# Patient Record
Sex: Male | Born: 1985 | Race: Black or African American | Hispanic: No | Smoking: Current some day smoker
Health system: Southern US, Community
[De-identification: ages and names within clinical notes are randomized; demographics above are authoritative.]

## PROBLEM LIST (undated history)

## (undated) DIAGNOSIS — J45909 Unspecified asthma, uncomplicated: Secondary | ICD-10-CM

---

## 2008-12-12 ENCOUNTER — Emergency Department: Payer: Self-pay | Admitting: Internal Medicine

## 2009-12-20 ENCOUNTER — Emergency Department: Payer: Self-pay | Admitting: Emergency Medicine

## 2010-10-06 ENCOUNTER — Emergency Department: Payer: Self-pay | Admitting: Emergency Medicine

## 2010-10-08 ENCOUNTER — Emergency Department: Payer: Self-pay | Admitting: Emergency Medicine

## 2015-07-15 ENCOUNTER — Emergency Department
Admission: EM | Admit: 2015-07-15 | Discharge: 2015-07-15 | Disposition: A | Discharge status: Home / Self Care | Payer: BLUE CROSS/BLUE SHIELD | Attending: Emergency Medicine | Admitting: Emergency Medicine

## 2015-07-15 ENCOUNTER — Emergency Department: Payer: BLUE CROSS/BLUE SHIELD

## 2015-07-15 ENCOUNTER — Encounter: Payer: Self-pay | Admitting: Emergency Medicine

## 2015-07-15 DIAGNOSIS — F17200 Nicotine dependence, unspecified, uncomplicated: Secondary | ICD-10-CM | POA: Insufficient documentation

## 2015-07-15 DIAGNOSIS — R05 Cough: Secondary | ICD-10-CM | POA: Diagnosis present

## 2015-07-15 DIAGNOSIS — J069 Acute upper respiratory infection, unspecified: Secondary | ICD-10-CM | POA: Insufficient documentation

## 2015-07-15 LAB — RAPID INFLUENZA A&B ANTIGENS (ARMC ONLY): INFLUENZA A (ARMC): NEGATIVE

## 2015-07-15 LAB — RAPID INFLUENZA A&B ANTIGENS: Influenza B (ARMC): NEGATIVE

## 2015-07-15 MED ORDER — PREDNISONE 10 MG PO TABS: ORAL_TABLET | ORAL | Status: AC

## 2015-07-15 MED ORDER — ACETAMINOPHEN 500 MG PO TABS
1000.0000 mg | ORAL_TABLET | Freq: Once | ORAL | Status: AC
  Administered 2015-07-15: 1000 mg via ORAL

## 2015-07-15 NOTE — ED Notes (Signed)
Patient states has had cough x 2 days. Fever began today.

## 2015-07-15 NOTE — ED Provider Notes (Signed)
[facility] Emergency Department Provider Note  ____________________________________________  Time seen: Approximately 5:24 PM  I have reviewed the triage vital signs and the nursing notes.   HISTORY  Chief Complaint Cough   HPI Maurice Estrada is a 30 y.o. male is here with complaint of cough and congestion for 2 weeks. Yesterday states he got much worse and he began running fever. He states that last night he had problems with his asthma however he did have a inhaler and has been using it. On arrival to the emergency room in a temperature of 103.1. He has not taken any over-the-counter medication for his fever prior to his arrival.   History reviewed. No pertinent past medical history.  There are no active problems to display for this patient.   History reviewed. No pertinent past surgical history.  Current Outpatient Rx  Name  Route  Sig  Dispense  Refill  . predniSONE (DELTASONE) 10 MG tablet      Take 6 tablets  today, on day 2 take 5 tablets, day 3 take 4 tablets, day 4 take 3 tablets, day 5 take  2 tablets and 1 tablet the last day   21 tablet   0     Allergies Review of patient's allergies indicates no known allergies.  No family history on file.  Social History Social History  Substance Use Topics  . Smoking status: Current Some Day Smoker  . Smokeless tobacco: None  . Alcohol Use: Yes    Review of Systems Constitutional: No fever/chills ENT: No sore throat. Cardiovascular: Denies chest pain. Respiratory: Denies shortness of breath.Positive cough. Gastrointestinal: No abdominal pain.  No nausea, no vomiting.  No diarrhea.  Musculoskeletal: Generalized body aches. Skin: Negative for rash. Neurological: Positive for headaches, no focal weakness or numbness.  10-point ROS otherwise negative.  ____________________________________________   PHYSICAL EXAM:  VITAL SIGNS: ED Triage Vitals  Enc Vitals Group     BP 07/15/15 1536  148/53 mmHg     Pulse Rate 07/15/15 1536 99     Resp --      Temp 07/15/15 1536 103.1 F (39.5 C)     Temp Source 07/15/15 1536 Oral     SpO2 07/15/15 1536 97 %     Weight 07/15/15 1536 300 lb (136.079 kg)     Height 07/15/15 1536 6' (1.829 m)     Head Cir --      Peak Flow --      Pain Score 07/15/15 1538 8     Pain Loc --      Pain Edu? --      Excl. in GC? --     Constitutional: Alert and oriented. Well appearing and in no acute distress.Currently patient is somewhat clammy. He doesn't feel like his temperature is as high as it was when he arrived in the emergency room.He denies any chest pain or difficulty breathing. Eyes: Conjunctivae are normal. PERRL. EOMI. Head: Atraumatic. Nose: No congestion/rhinnorhea. EACs and TMs are clear bilaterally. Mouth/Throat: Mucous membranes are moist.  Oropharynx non-erythematous. Neck: No stridor.   Hematological/Lymphatic/Immunilogical: No cervical lymphadenopathy. Cardiovascular: Normal rate, regular rhythm. Grossly normal heart sounds.  Good peripheral circulation. Respiratory: Normal respiratory effort.  No retractions. Lungs CTAB. No wheezing is heard at present. Patient moving air with normal effort. Gastrointestinal: Soft and nontender. No distention. Musculoskeletal: Moves upper and lower extremities without difficulty. Normal gait was noted. Neurologic:  Normal speech and language. No gross focal neurologic deficits are appreciated. No gait  instability. Skin:  Skin is warm, dry and intact. No rash noted. Psychiatric: Mood and affect are normal. Speech and behavior are normal.  ____________________________________________   LABS (all labs ordered are listed, but only abnormal results are displayed)  Labs Reviewed  RAPID INFLUENZA A&B ANTIGENS ([facility] ONLY)   RADIOLOGY  Chest x-ray per radiologist shows no evidence of acute cardiopulmonary disease. ____________________________________________   PROCEDURES  Procedure(s)  performed: None  Critical Care performed: No  ____________________________________________   INITIAL IMPRESSION / ASSESSMENT AND PLAN / ED COURSE  Pertinent labs & imaging results that were available during my care of the patient were reviewed by me and considered in my medical decision making (see chart for details).  Patient's influenza test was negative for a and B. He was reassured that his chest x-ray did not show bronchitis or pneumonia. Patient will continue using his albuterol inhaler and Mucinex been taken at home. He was also given a prescription for prednisone should his asthma continue to flare however he is going to hold on to the prescription says currently he is not wheezing. Patient will [facility] if any continued problems. ____________________________________________   FINAL CLINICAL IMPRESSION(S) / ED DIAGNOSES  Final diagnoses:  Acute upper respiratory infection      Tommi RumpsRhonda L Agustus Mane, PA-C 07/15/15 1808  Rockne MenghiniAnne-Caroline Norman, MD 07/15/15 2147

## 2015-07-15 NOTE — Discharge Instructions (Signed)

## 2015-07-15 NOTE — ED Notes (Signed)
Pt with cough and congestion x 2 weeks; got worse yesterday. Fever developed overnight. Pt alert & oriented with NAD noted.

## 2016-01-26 ENCOUNTER — Encounter: Payer: Self-pay | Admitting: Emergency Medicine

## 2016-01-26 ENCOUNTER — Emergency Department
Admission: EM | Admit: 2016-01-26 | Discharge: 2016-01-26 | Disposition: A | Discharge status: Home / Self Care | Payer: BLUE CROSS/BLUE SHIELD | Attending: Emergency Medicine | Admitting: Emergency Medicine

## 2016-01-26 DIAGNOSIS — J45909 Unspecified asthma, uncomplicated: Secondary | ICD-10-CM | POA: Insufficient documentation

## 2016-01-26 DIAGNOSIS — M545 Low back pain, unspecified: Secondary | ICD-10-CM

## 2016-01-26 DIAGNOSIS — F17200 Nicotine dependence, unspecified, uncomplicated: Secondary | ICD-10-CM | POA: Insufficient documentation

## 2016-01-26 HISTORY — DX: Unspecified asthma, uncomplicated: J45.909

## 2016-01-26 NOTE — ED Triage Notes (Signed)
3 months ago pt began with back pain. Worse recently. Pain to lower back.

## 2016-01-26 NOTE — ED Provider Notes (Signed)
[facility] Emergency Department Provider Note   ____________________________________________    I have reviewed the triage vital signs and the nursing notes.   HISTORY  Chief Complaint Back Pain     HPI Maurice Estrada is a 30 y.o. male who presents with 3 months of intermittent aching, lower, midline back pain. He denies neuro deficits. No difficulty urinating. No fevers or chills. He notes back pain frequently occurs in the morning after sleeping. He denies abdominal pain. He has not taken anything for it currently.   Past Medical History:  Diagnosis Date  . Asthma     There are no active problems to display for this patient.   History reviewed. No pertinent surgical history.  Prior to Admission medications   Medication Sig Start Date End Date Taking? Authorizing Provider  predniSONE (DELTASONE) 10 MG tablet Take 6 tablets  today, on day 2 take 5 tablets, day 3 take 4 tablets, day 4 take 3 tablets, day 5 take  2 tablets and 1 tablet the last day 07/15/15   Tommi Rumps, PA-C     Allergies Review of patient's allergies indicates no known allergies.  History reviewed. No pertinent family history.  Social History Social History  Substance Use Topics  . Smoking status: Current Some Day Smoker  . Smokeless tobacco: Not on file  . Alcohol use Yes    Review of Systems  Constitutional: No fever/chills     Gastrointestinal: No abdominal pain.   Genitourinary: Negative forIncontinence Musculoskeletal: As above Skin: Negative for rash. Neurological: Negative for headaches     ____________________________________________   PHYSICAL EXAM:  VITAL SIGNS: ED Triage Vitals  Enc Vitals Group     BP 01/26/16 1529 135/65     Pulse Rate 01/26/16 1529 64     Resp 01/26/16 1529 16     Temp 01/26/16 1529 98.3 F (36.8 C)     Temp Source 01/26/16 1529 Oral     SpO2 01/26/16 1529 96 %     Weight 01/26/16 1530 280 lb (127 kg)   Height 01/26/16 1530 6' (1.829 m)     Head Circumference --      Peak Flow --      Pain Score --      Pain Loc --      Pain Edu? --      Excl. in GC? --     Constitutional: Alert and oriented. No acute distress. Pleasant and interactive Eyes: Conjunctivae are normal.   Nose: No congestion/rhinnorhea. Mouth/Throat: Mucous membranes are moist.   Cardiovascular: Normal rate, regular rhythm.  Respiratory: Normal respiratory effort.  No retractions. Genitourinary: deferred Musculoskeletal: Back: No vertebral tenderness to palpation, no paraspinal tenderness to palpation. Full range of motion without difficulty. Normal ambulation. Normal strength in the lower extremities Neurologic:  Normal speech and language. No gross focal neurologic deficits are appreciated.  No saddle anesthesia Skin:  Skin is warm, dry and intact. No rash noted.   ____________________________________________   LABS (all labs ordered are listed, but only abnormal results are displayed)  Labs Reviewed - No data to display ____________________________________________  EKG   ____________________________________________  RADIOLOGY  None ____________________________________________   PROCEDURES  Procedure(s) performed: No    Critical Care performed: No ____________________________________________   INITIAL IMPRESSION / ASSESSMENT AND PLAN / ED COURSE  Pertinent labs & imaging results that were available during my care of the patient were reviewed by me and considered in my medical decision making (see chart  for details).  Patient well-appearing and in no distress. Vital signs are normal. Exam is benign. We discussed lifestyle changes for his chronic back pain.   ____________________________________________   FINAL CLINICAL IMPRESSION(S) / ED DIAGNOSES  Final diagnoses:  Midline low back pain without sciatica, unspecified chronicity      NEW MEDICATIONS STARTED DURING THIS  VISIT:  Discharge Medication List as of 01/26/2016  3:54 PM       Note:  This document was prepared using Dragon voice recognition software and may include unintentional dictation errors.    Jene Everyobert Tama Grosz, MD 01/26/16 2110

## 2017-09-07 ENCOUNTER — Emergency Department
Admission: EM | Admit: 2017-09-07 | Discharge: 2017-09-07 | Disposition: A | Discharge status: Home / Self Care | Payer: BLUE CROSS/BLUE SHIELD | Attending: Emergency Medicine | Admitting: Emergency Medicine

## 2017-09-07 ENCOUNTER — Encounter: Payer: Self-pay | Admitting: Emergency Medicine

## 2017-09-07 DIAGNOSIS — R070 Pain in throat: Secondary | ICD-10-CM | POA: Insufficient documentation

## 2017-09-07 DIAGNOSIS — J029 Acute pharyngitis, unspecified: Secondary | ICD-10-CM

## 2017-09-07 DIAGNOSIS — J45909 Unspecified asthma, uncomplicated: Secondary | ICD-10-CM | POA: Diagnosis not present

## 2017-09-07 DIAGNOSIS — F17200 Nicotine dependence, unspecified, uncomplicated: Secondary | ICD-10-CM | POA: Insufficient documentation

## 2017-09-07 LAB — GROUP A STREP BY PCR: Group A Strep by PCR: NOT DETECTED

## 2017-09-07 MED ORDER — MAGIC MOUTHWASH W/LIDOCAINE: 5.0000 mL | Freq: Four times a day (QID) | ORAL | 0 refills | Status: AC

## 2017-09-07 NOTE — ED Triage Notes (Signed)
Pt states yesterday started with a sore throat and saw some white spots on the right side. Pt denies fevers or feeling sick.

## 2017-09-07 NOTE — Discharge Instructions (Signed)
Your rapid strep test was negative.  Follow discharge care instructions to take medication as directed.  Follow-up with PCP if complaints persist.

## 2017-09-07 NOTE — ED Provider Notes (Signed)
[facility] Emergency Department Provider Note   ____________________________________________   First MD Initiated Contact with Patient 09/07/17 1053     (approximate)  I have reviewed the triage vital signs and the nursing notes.   HISTORY  Chief Complaint Sore Throat    HPI Maurice Estrada is a 32 y.o. male patient complain of 1 day of sore throat.  Patient denies URI signs or symptoms.  Patient state noticed white spot on the right tonsil this morning.  Patient rates pain as a 2/10.  Patient described the pain is "sore".  No palliative measure for complaint.  Past Medical History:  Diagnosis Date  . Asthma     There are no active problems to display for this patient.   History reviewed. No pertinent surgical history.  Prior to Admission medications   Medication Sig Start Date End Date Taking? Authorizing Provider  magic mouthwash w/lidocaine SOLN Take 5 mLs by mouth 4 (four) times daily. 09/07/17   Joni Reining, PA-C  predniSONE (DELTASONE) 10 MG tablet Take 6 tablets  today, on day 2 take 5 tablets, day 3 take 4 tablets, day 4 take 3 tablets, day 5 take  2 tablets and 1 tablet the last day 07/15/15   Tommi Rumps, PA-C    Allergies Patient has no known allergies.  No family history on file.  Social History Social History   Tobacco Use  . Smoking status: Current Some Day Smoker  Substance Use Topics  . Alcohol use: Yes  . Drug use: Not on file    Review of Systems Constitutional: No fever/chills Eyes: No visual changes. ENT: Sore throat.   Cardiovascular: Denies chest pain. Respiratory: Denies shortness of breath. Gastrointestinal: No abdominal pain.  No nausea, no vomiting.  No diarrhea.  No constipation. Genitourinary: Negative for dysuria. Musculoskeletal: Negative for back pain. Skin: Negative for rash. Neurological: Negative for headaches, focal weakness or  numbness.   ____________________________________________   PHYSICAL EXAM:  VITAL SIGNS: ED Triage Vitals  Enc Vitals Group     BP 09/07/17 1026 131/66     Pulse Rate 09/07/17 1026 67     Resp 09/07/17 1026 16     Temp 09/07/17 1026 98.9 F (37.2 C)     Temp Source 09/07/17 1026 Oral     SpO2 09/07/17 1026 95 %     Weight 09/07/17 1027 270 lb (122.5 kg)     Height 09/07/17 1027 6' (1.829 m)     Head Circumference --      Peak Flow --      Pain Score 09/07/17 1032 2     Pain Loc --      Pain Edu? --      Excl. in GC? --    Constitutional: Alert and oriented. Well appearing and in no acute distress. Mouth/Throat: Mucous membranes are moist.  Oropharynx erythematous. Neck: No stridor.  No cervical spine tenderness to palpation. Hematological/Lymphatic/Immunilogical: No cervical lymphadenopathy. Cardiovascular: Normal rate, regular rhythm. Grossly normal heart sounds.  Good peripheral circulation. Respiratory: Normal respiratory effort.  No retractions. Lungs CTAB. Skin:  Skin is warm, dry and intact. No rash noted. Psychiatric: Mood and affect are normal. Speech and behavior are normal.  ____________________________________________   LABS (all labs ordered are listed, but only abnormal results are displayed)  Labs Reviewed  GROUP A STREP BY PCR   ____________________________________________  EKG   ____________________________________________  RADIOLOGY  ED MD interpretation:    Official radiology report(s): No results  found.  ____________________________________________   PROCEDURES  Procedure(s) performed: None  Procedures  Critical Care performed: No  ____________________________________________   INITIAL IMPRESSION / ASSESSMENT AND PLAN / ED COURSE  As part of my medical decision making, I reviewed the following data within the electronic MEDICAL RECORD NUMBER    Sore throat secondary to viral pharyngitis.  Discussed negative strep results with  patient.  Patient given discharge care instruction advised take medication as directed.  Patient advised to follow-up PCP for continued care.      ____________________________________________   FINAL CLINICAL IMPRESSION(S) / ED DIAGNOSES  Final diagnoses:  Sore throat     ED Discharge Orders        Ordered    magic mouthwash w/lidocaine SOLN  4 times daily     09/07/17 1340       Note:  This document was prepared using Dragon voice recognition software and may include unintentional dictation errors.    Joni Reining, PA-C 09/07/17 1342    Emily Filbert, MD 09/07/17 [phone]

## 2017-09-07 NOTE — ED Notes (Signed)
See triage note  Presents with "white" patches to right side of throat    States he noted area yesterday  No fever or diff swallowing

## 2017-11-01 IMAGING — CR DG CHEST 2V
1 series · 2 of 2 positions shown · non-contrast
Comparison: Chest x-ray 10/08/2010.

CLINICAL DATA: 29-year-old male with history of asthma attack
yesterday evening. Coughing for the past 2 weeks.

EXAM:
CHEST  2 VIEW

[Series 1: w chest pa · 0.14mm/px · 2 of 2 slices shown]
[im 1/2]
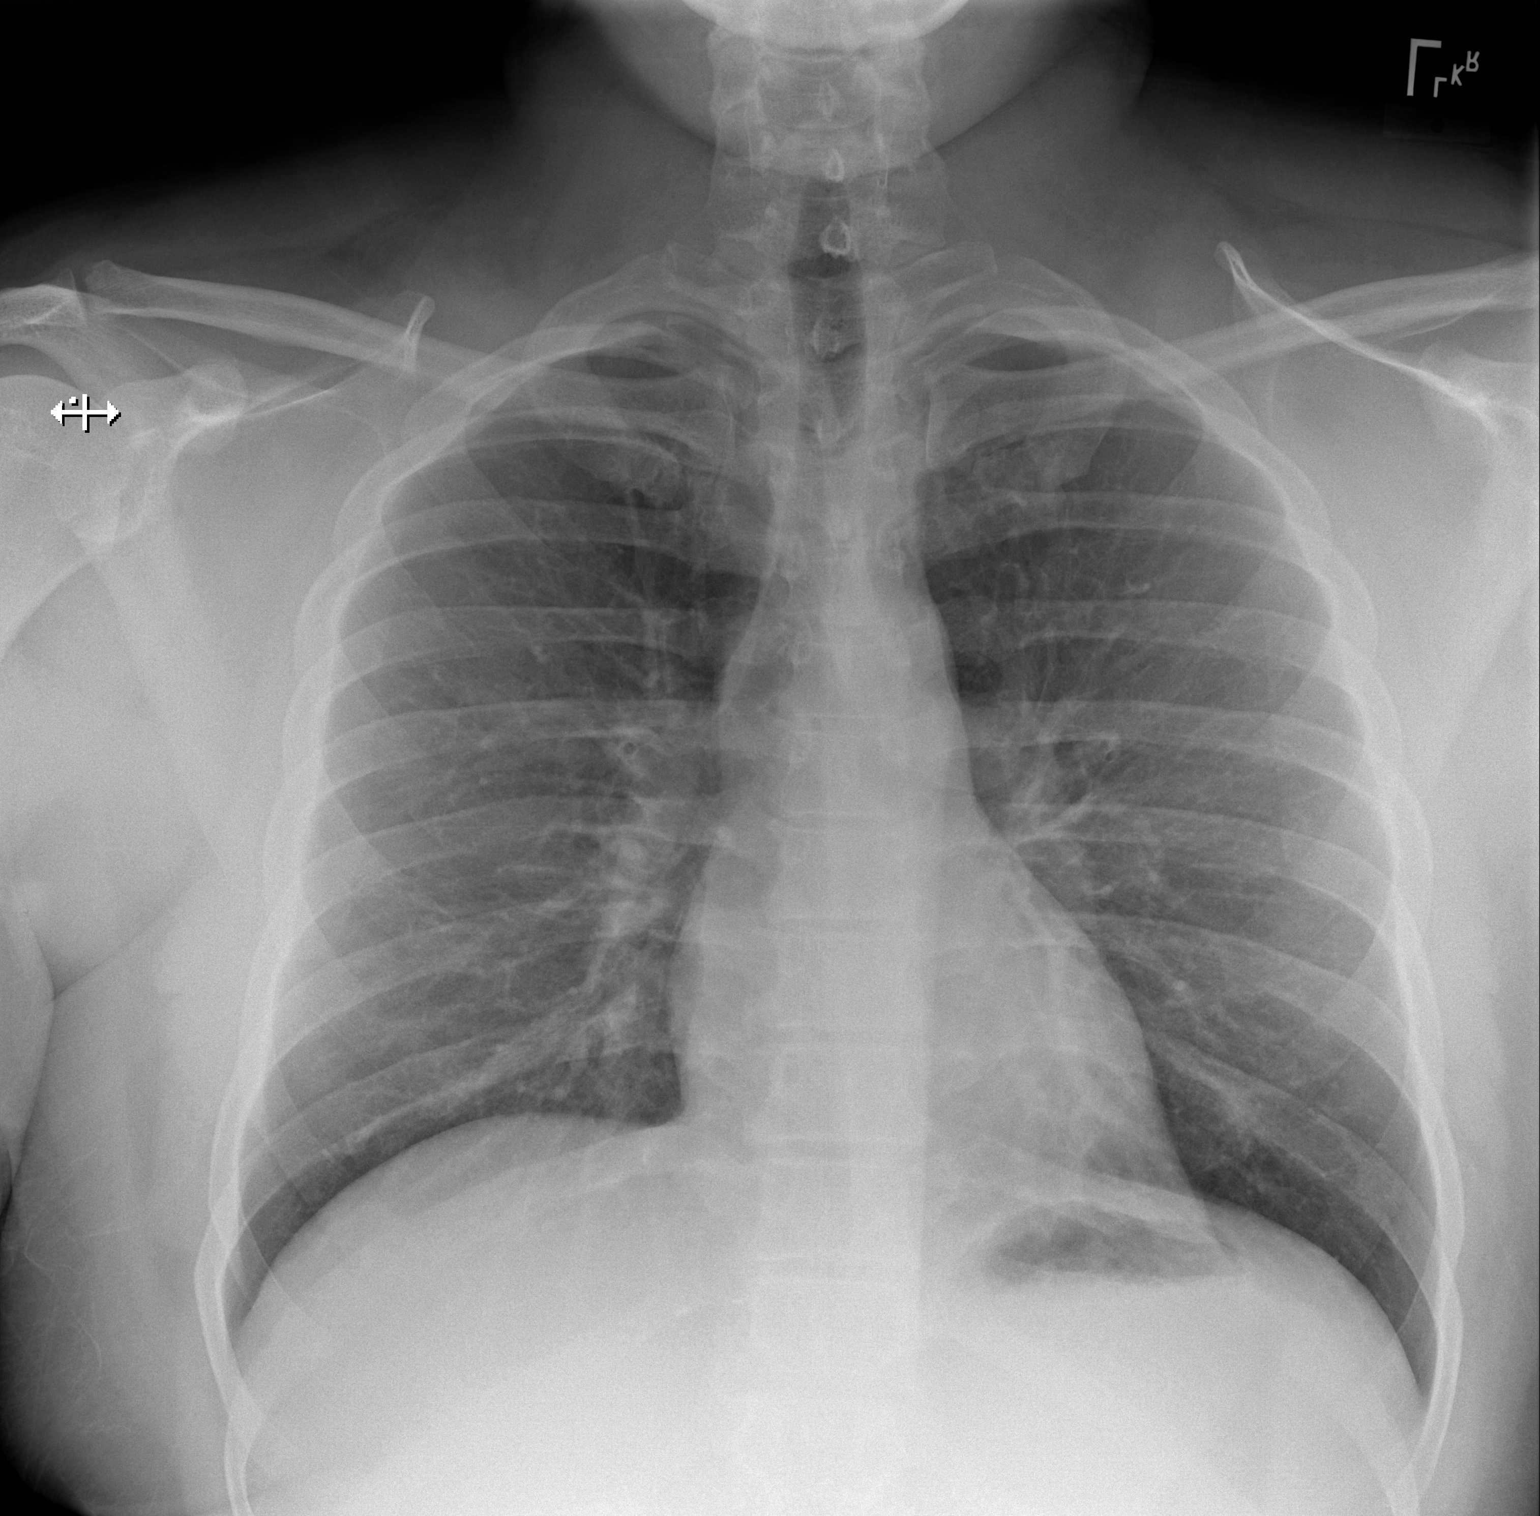
[im 2/2]
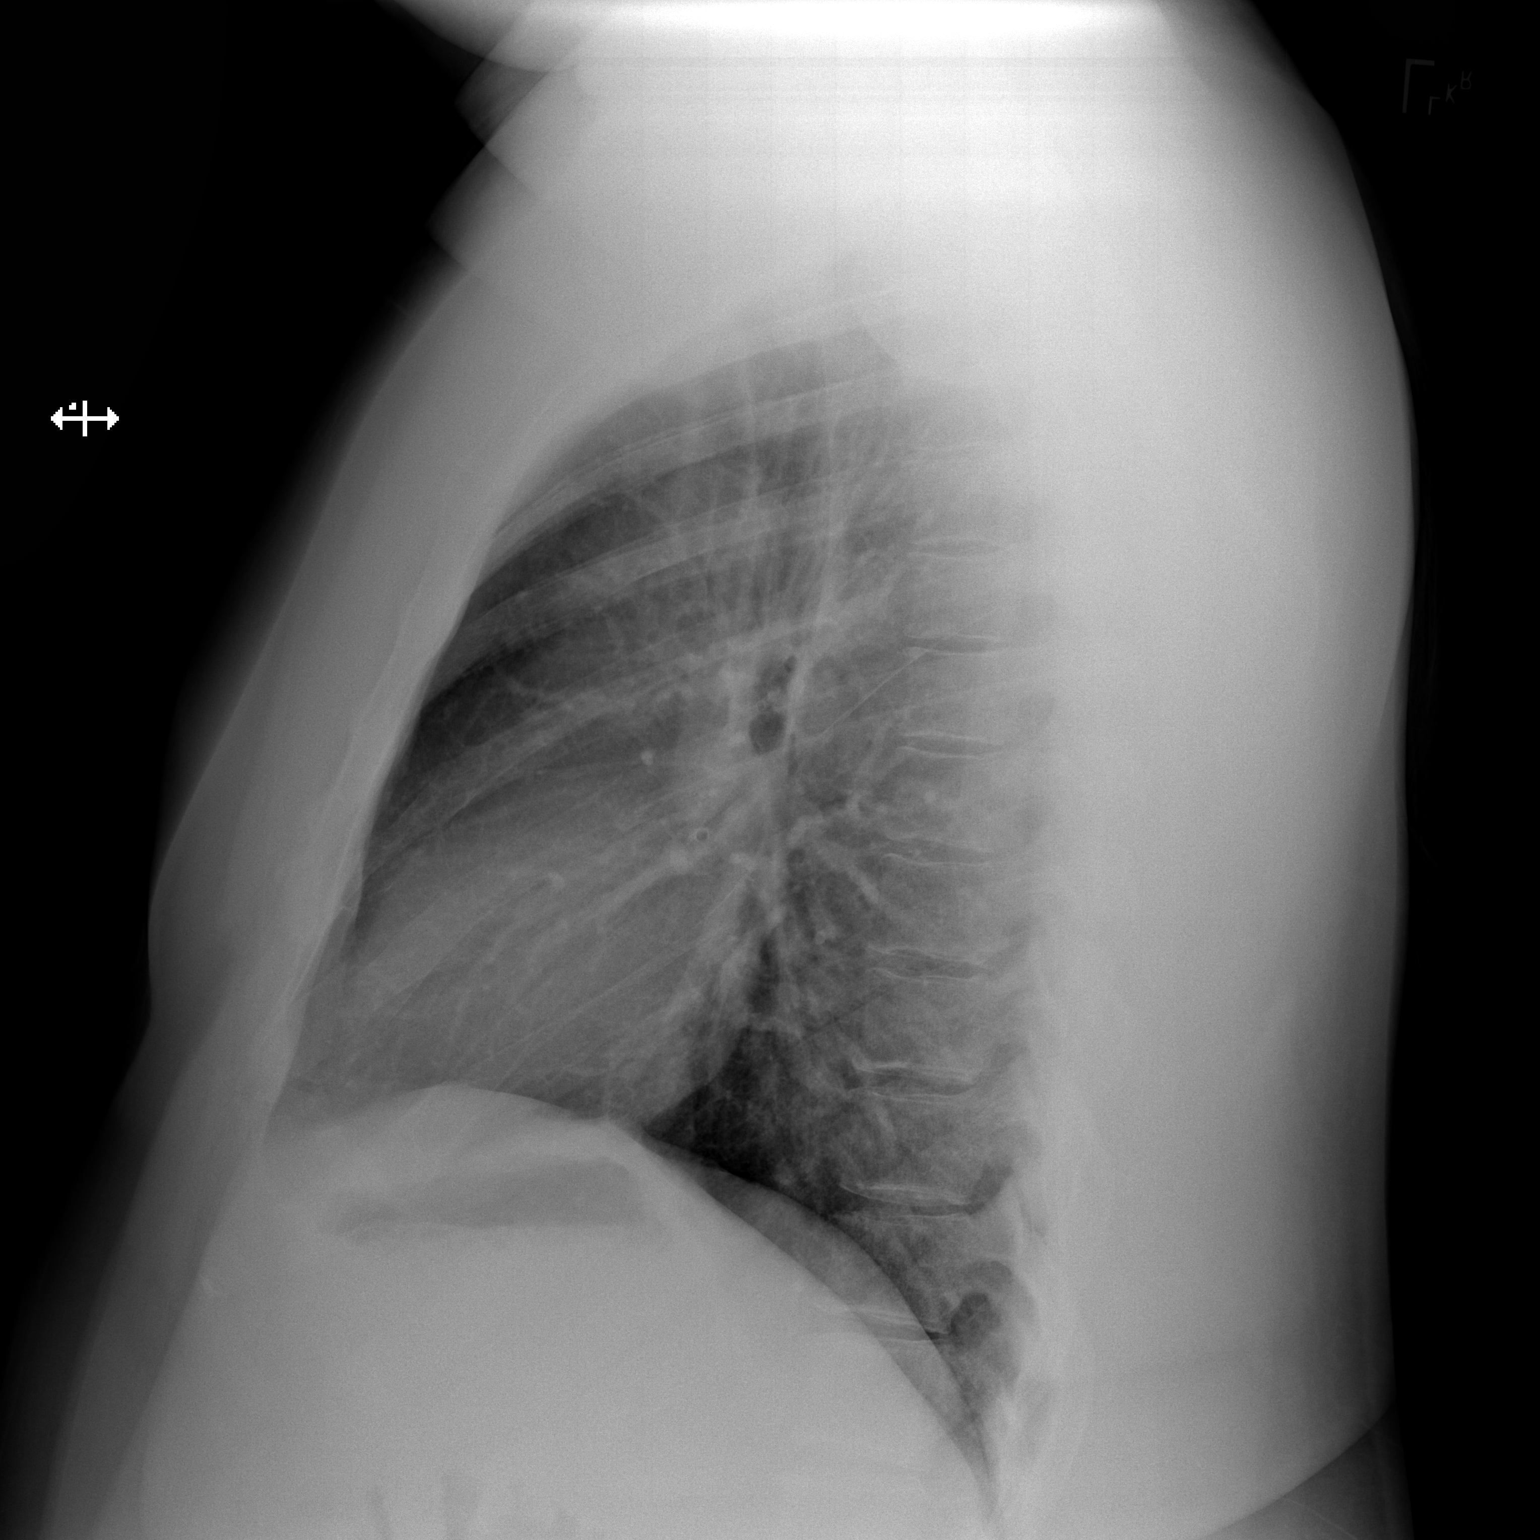

[2 of 2 positions shown; findings below may reference images not displayed]

FINDINGS: Lung volumes are normal. No consolidative airspace disease. No
pleural effusions. No pneumothorax. No pulmonary nodule or mass
noted. Pulmonary vasculature and the cardiomediastinal silhouette
are within normal limits.
IMPRESSION: No radiographic evidence of acute cardiopulmonary disease.
